# Patient Record
Sex: Male | Born: 1975 | Race: White | Hispanic: No | Marital: Married | Smoking: Current every day smoker
Health system: Southern US, Community
[De-identification: ages and names within clinical notes are randomized; demographics above are authoritative.]

## PROBLEM LIST (undated history)

## (undated) HISTORY — PX: TESTICLE SURGERY: SHX794

## (undated) HISTORY — PX: HERNIA REPAIR: SHX51

---

## 2013-12-01 ENCOUNTER — Emergency Department: Payer: Self-pay | Admitting: Emergency Medicine

## 2015-09-27 ENCOUNTER — Emergency Department (HOSPITAL_COMMUNITY)
Admission: EM | Admit: 2015-09-27 | Discharge: 2015-09-28 | Disposition: A | Discharge status: Home / Self Care | Payer: Worker's Compensation | Attending: Physician Assistant | Admitting: Physician Assistant

## 2015-09-27 ENCOUNTER — Emergency Department (HOSPITAL_COMMUNITY): Payer: Worker's Compensation

## 2015-09-27 ENCOUNTER — Encounter (HOSPITAL_COMMUNITY): Payer: Self-pay | Admitting: Emergency Medicine

## 2015-09-27 DIAGNOSIS — X58XXXA Exposure to other specified factors, initial encounter: Secondary | ICD-10-CM | POA: Diagnosis not present

## 2015-09-27 DIAGNOSIS — Y998 Other external cause status: Secondary | ICD-10-CM | POA: Insufficient documentation

## 2015-09-27 DIAGNOSIS — S0592XA Unspecified injury of left eye and orbit, initial encounter: Secondary | ICD-10-CM | POA: Diagnosis not present

## 2015-09-27 DIAGNOSIS — Y9289 Other specified places as the place of occurrence of the external cause: Secondary | ICD-10-CM | POA: Insufficient documentation

## 2015-09-27 DIAGNOSIS — Y9389 Activity, other specified: Secondary | ICD-10-CM | POA: Diagnosis not present

## 2015-09-27 DIAGNOSIS — H5789 Other specified disorders of eye and adnexa: Secondary | ICD-10-CM

## 2015-09-27 MED ORDER — TETRACAINE HCL 0.5 % OP SOLN
2.0000 [drp] | Freq: Once | OPHTHALMIC | Status: AC
  Administered 2015-09-27: 2 [drp] via OPHTHALMIC
  Filled 2015-09-27: qty 2

## 2015-09-27 MED ORDER — FLUORESCEIN SODIUM 1 MG OP STRP
1.0000 | ORAL_STRIP | Freq: Once | OPHTHALMIC | Status: AC
  Administered 2015-09-27: 1 via OPHTHALMIC
  Filled 2015-09-27: qty 1

## 2015-09-27 NOTE — ED Provider Notes (Signed)
CSN: [id number]     Arrival date & time 09/27/15  2101 History  By signing my name below, I, Lyndel SafeKaitlyn Shelton, attest that this documentation has been prepared under the direction and in the presence of [employer] Tiara Maultsby, PA-C. Electronically Signed: Lyndel SafeKaitlyn Shelton, ED Scribe. 09/27/2015. 9:31 PM.   Chief Complaint  Patient presents with  . Eye Pain   The history is provided by the patient. No language interpreter was used.   HPI Comments: Jovita KussmaulMatthew Hollings is a 40 y.o. male who presents to the Emergency Department complaining of sudden onset, constant, severe burning pain to left eye s/p injury that occurred 1 hour ago, pain completely resolved and patient was walking from the waiting room into the emergency department. Pt reports he was grinding the edges of a metal sheet when a piece of metal flew behind his safety goggles into his left eye. He reports he flushed his left eye with water for 15 minutes after the incident without relief of burning and irritation to eye. Pt mentions a prior similar episode when he needed rust ring removed Last Tdap 2015.   History reviewed. No pertinent past medical history. History reviewed. No pertinent past surgical history. No family history on file. Social History  Substance Use Topics  . Smoking status: Never Smoker   . Smokeless tobacco: None  . Alcohol Use: No    Review of Systems A complete 10 system review of systems was obtained and is otherwise negative except at noted in the HPI and PMH.  Allergies  Review of patient's allergies indicates no known allergies.  Home Medications   Prior to Admission medications   Not on File   BP 128/91 mmHg  Pulse 71  Temp(Src) 98 F (36.7 C) (Oral)  Resp 18  Ht 5\' 6"  (1.676 m)  Wt 165 lb 8 oz (75.07 kg)  BMI 26.73 kg/m2  SpO2 97% Physical Exam  Constitutional: He is oriented to person, place, and time. He appears well-developed and well-nourished. No distress.  HENT:  Head: Normocephalic.  Lids  and lashes normal.  Pupils equal round and reactive to light, no conjunctival injection,  extraocular movement is intact.   No abnormal uptake on fluorescein stain, slit-lamp with normal anterior chamber exam.  Eyes: Conjunctivae and EOM are normal.  Neck: Normal range of motion. Neck supple.  Cardiovascular: Normal rate.   Pulmonary/Chest: Effort normal. No stridor. No respiratory distress.  Musculoskeletal: Normal range of motion.  Neurological: He is alert and oriented to person, place, and time. Coordination normal.  Skin: Skin is warm.  Psychiatric: He has a normal mood and affect. His behavior is normal.  Nursing note and vitals reviewed.   ED Course  Procedures  DIAGNOSTIC STUDIES: Oxygen Saturation is 97% on RA, normal by my interpretation.    COORDINATION OF CARE: 9:29 PM Discussed treatment plan which includes to perform eye exam and order CT of orbits with pt. Pt acknowledges and agrees to plan.    Imaging Review No results found. I have personally reviewed and evaluated these images results as part of my medical decision-making.   MDM   Final diagnoses:  Irritation of left eye    Filed Vitals:   09/27/15 2106 09/28/15 0047  BP: 128/91 122/86  Pulse: 71 68  Temp: 98 F (36.7 C)   TempSrc: Oral   Resp: 18 17  Height: 5\' 6"  (1.676 m)   Weight: 75.07 kg   SpO2: 97% 98%    Medications  fluorescein ophthalmic strip 1  strip (1 strip Left Eye Given 09/27/15 2150)  tetracaine (PONTOCAINE) 0.5 % ophthalmic solution 2 drop (2 drops Right Eye Given 09/27/15 2150)    Harold Moncus is 40 y.o. male presenting with foreign body sensation to left eye, visual acuity normal. Slit lamp and fluorescein stain negative, CT orbits with no radiopaque foreign body. Patient advised to follow closely with ophthalmology. Extensive discussion of return precautions.  Evaluation does not show pathology that would require ongoing emergent intervention or inpatient treatment. Pt is  hemodynamically stable and mentating appropriately. Discussed findings and plan with patient/guardian, who agrees with care plan. All questions answered. Return precautions discussed and outpatient follow up given.   I personally performed the services described in this documentation, which was scribed in my presence. The recorded information has been reviewed and is accurate.     Wynetta Emery, PA-C 09/28/15 0053  Courteney Randall An, MD 10/02/15 [id number]

## 2015-09-27 NOTE — ED Notes (Signed)
Pt was grinding metal and feels like something went behind his safety goggles. Pt states he flushed his eyes out with water for about 10 minutes. denies any blurry vision just burning and irration to left eye.

## 2015-09-28 NOTE — ED Notes (Signed)
Pt stable, ambulatory, states understanding of discharge instructions 

## 2015-09-28 NOTE — Discharge Instructions (Signed)
Do not hesitate to return to the emergency room for any new, worsening or concerning symptoms. ° °Please obtain primary care using resource guide below. Let them know that you were seen in the emergency room and that they will need to obtain records for further outpatient management. ° ° ° °Emergency Department Resource Guide °1) Find a Doctor and Pay Out of Pocket °Although you won't have to find out who is covered by your insurance plan, it is a good idea to ask around and get recommendations. You will then need to call the office and see if the doctor you have chosen will accept you as a new patient and what types of options they offer for patients who are self-pay. Some doctors offer discounts or will set up payment plans for their patients who do not have insurance, but you will need to ask so you aren't surprised when you get to your appointment. ° °2) Contact Your Local Health Department °Not all health departments have doctors that can see patients for sick visits, but many do, so it is worth a call to see if yours does. If you don't know where your local health department is, you can check in your phone book. The CDC also has a tool to help you locate your state's health department, and many state websites also have listings of all of their local health departments. ° °3) Find a Walk-in Clinic °If your illness is not likely to be very severe or complicated, you may want to try a walk in clinic. These are popping up all over the country in pharmacies, drugstores, and shopping centers. They're usually staffed by nurse practitioners or physician assistants that have been trained to treat common illnesses and complaints. They're usually fairly quick and inexpensive. However, if you have serious medical issues or chronic medical problems, these are probably not your best option. ° °No Primary Care Doctor: °- Call [facility] at  832-8000 - they can help you locate a primary care doctor that  accepts your  insurance, provides certain services, etc. °- Physician Referral Service- 1-800-533-3463 ° °Chronic Pain Problems: °Organization         Address  Phone   Notes  °[facility]  (336) 297-2271 Patients need to be referred by their primary care doctor.  ° °Medication Assistance: °Organization         Address  Phone   Notes  °[place] [facility] [address] °[place], [place] [address] (336) 641-8030 --Must be a resident of [place] °-- Must have NO insurance coverage whatsoever (no Medicaid/ Medicare, etc.) °-- The pt. MUST have a primary care doctor that directs their care regularly and follows them in the community °  °[facility]  (866) 331-1348   °[employer]  (888) 892-1162   ° °Agencies that provide inexpensive medical care: °Organization         Address  Phone   Notes  °[facility]  (336) 832-8035   °[facility]    (336) 832-7272   °[facility] [address] °[place], [place] [address] (336) 832-4777   °[facility] [address], °[place] (336) 271-4999   °[facility]    (336) 373-0678   °[facility]    (336) 272-1050   °[facility] ° [address], [place] Phone:  (336) 832-4444, Fax:  (336) 832-4440 Hours of Operation:  9 am -   6 pm, M-F.  Also accepts Medicaid/Medicare and self-pay.  °[facility] ° [address], [place] Phone: (336) 832-3150, Fax: (336) [phone]. Hours of Operation:  8:30 am - 5:30 pm, M-F.  Also accepts Medicaid and self-pay.  °[facility] [place] [address], [place] Phone: (336) 878-6027   °[facility] [address], [place], [place] (336)723-1848, Ext. 123 Mondays & Thursdays: 7-9 AM.  First 15 patients are seen on a first come, first serve basis. °  ° °Medicaid-accepting [place] Providers: ° °Organization          Address  Phone   Notes  °[facility] [address], [place] (336) 641-2100 Also accepts self-pay patients.  °[facility] [address], [place] ° (336) 856-9996   °[facility] [address], [place] (336) 288-8857   °[facility] [address] [place] [address], [place] (336) 299-7000   °Veita Bland [address], [place]  ° (336) 373-1557 Only accepts [place] Access Medicaid patients after they have their name applied to their card.  ° °Self-Pay (no insurance) in [place]: ° °Organization         Address  Phone   Notes  °Sickle Cell Patients, [facility] [address], [place] (336) 832-1970   °[facility] [address], [place] (336) 832-4400   °[facility] ° [address], [place] (336) 992-4800   °[facility]/Dr. Osei-Bonsu ° [address] [place] [address], [place] or [address], [place] (336) 841-8500 Phone number for both [place] and [place] locations is the same.  °[facility] [address], [place] (336) 299-0000   °[facility] [address] [place] [address], [place] or [address] (336) 852-7530 °(336) 878-2260   °[facility] [address], [place] (336) 350-1642, phone; (336) 294-5005, fax Sees patients 1st and 3rd Saturday of every month.  Must not qualify for public or private insurance (i.e. Medicaid, Medicare, [place] Health Choice, Veterans' Benefits) • Household income should be no more than 200% of the poverty level •The clinic cannot treat you if you are pregnant or think you are pregnant • Sexually transmitted diseases are not treated at the clinic.  ° ° °Dental Care: °Organization         Address  Phone  Notes  °[facility] [address],   (336) 641-6152 Accepts children up to age 21 who are enrolled in Medicaid or [place] Health Choice; pregnant women with a Medicaid card; and children who have applied for Medicaid or [place] Health Choice, but were declined, whose parents can pay a reduced fee at time of service.  °[facility] [place]  [address], [place] (336) 641-7733 Accepts children up to age 21 who are enrolled in Medicaid or  Health Choice; pregnant women with a Medicaid card; and children who have applied for Medicaid or  Health Choice, but were declined, whose parents can pay a reduced fee at time of service.  °[facility] ° [address],  (336) 641-4533 Patients are seen by appointment only. Walk-ins are not accepted. [facility] will see patients 18 years of age and   older. °Monday - Tuesday (8am-5pm) °Most Wednesdays (8:30-5pm) °$30 per visit, cash only  °[facility] ° [address], [place] (336) 641-4533 Patients are seen by appointment only. Walk-ins are not accepted. [facility] will see patients 18 years of age and older. °One Wednesday Evening (Monthly: Volunteer Based).  $30 per visit, cash only  °[facility]  (919) 537-3737 for adults; Children under age 4, call Graduate Pediatric Dentistry at (919) [phone]. Children aged 4-14, please call (919) [phone] to request a pediatric application. ° Dental services are provided in all areas of dental care including fillings, crowns and bridges, complete and partial dentures, implants, gum treatment, root canals, and extractions. Preventive care is also provided. Treatment is provided to both adults and children. °Patients are selected via a lottery and there is often a waiting list. °  °[facility] [address], °[place] ° (336) 763-8833 [email or web address] °  °[facility] [address], [place], [place]  (336)723-1848, Ext. 123 Second and Fourth Thursday of each month, opens at 6:30 AM; Clinic ends at 9 AM.  Patients are seen on a first-come first-served basis, and a limited number are seen during each clinic.  ° °[facility] ° [address], [place], [place] (336) 723-7904   Eligibility Requirements °You must have lived in [place], [place], or [place] counties for at least the last three months. °  You cannot be eligible for state or federal sponsored healthcare insurance, including Veterans Administration, Medicaid, or Medicare. °  You generally cannot be eligible for healthcare insurance through your employer.  °  How to apply: °Eligibility screenings are held every Tuesday and Wednesday afternoon from 1:00 pm until 4:00 pm. You do not need an appointment for the interview!  °[facility] [address], [place], [place] 336-631-2330   °[facility]  336-342-8273   °[facility]  336-703-3100   °[facility]  336-570-6415   ° °Behavioral Health Resources in the Community: °Intensive Outpatient Programs °Organization         Address  Phone  Notes  °[facility] [address], [place], [place] 336-878-6098   °[facility] [address], [place], [place] 336-832-9800   °[facility] [address], [place], [place] ° 336-882-2125   °[facility] [address],  °[place], [place] 1-800-853-5163 or 336-641-4981   °Substance Abuse Resources °Organization         Address  Phone  Notes  °[facility]  336-882-2125   °[facility]  336-784-9470   °[facility]  336-285-9073   °[facility]  336-845-3988   °Residential & Outpatient Substance Abuse Program  1-800-659-3381   °Psychological Services °Organization         Address  Phone  Notes  °[facility]  336- 832-9600   °[facility]  336- 378-7881    °[facility] [address], [place] 1-800-853-5163 or 336-641-4981   ° °Mobile Crisis Teams °Organization         Address  Phone  Notes  °[facility], Mobile Crisis Care Unit  1-877-626-1772   °Assertive °[facility] ° [address] [place],  336-834-9664   °Sharon DeEsch [address] °[place]  336-554-5454   ° °Self-Help/Support Groups °Organization         Address    Phone             Notes  °[facility] - variety of support groups  336- 373-1402 Call for more information  °Narcotics Anonymous (NA), [facility] [address], °[place]  2 meetings at this location  ° °Residential Treatment Programs °Organization         Address  Phone  Notes  °[facility] [address],    °[place]  1-866-801-8205   °[facility] ° [address], [place], [place] 704-293-8524   °[facility] [address], [place] 336-845-3988 Admissions: 8am-3pm M-F  °[facility] [address]    °[place], [place] 336-841-1104   °[facility] [address], [place], [place] 336-379-7146   °[facility] [address]  °[place], [place] 336-285-9073   °[facility] - Intensive Outpatient [address], [place], [place] 336-852-3033   °[facility] [address]  °[place], [place] 1-877-615-2722 or 336-784-9470   °[facility] [address] [place], [place] 336-227-7417 Accepts Medicaid  °[facility] [address]  °[place] 1-800-659-3381 Substance Abuse/Addiction Treatment  ° °[place] Behavioral Health Resources °Organization         Address  Phone  Notes  °[facility]  (888) 581-9988   °Julie Brannon, PhD [address] [place], [place]   (336) 349-5553 or (336) 951-0000   °[facility]   [address] °[place], [place] (336) 349-4454   °[facility] [address], [place], [place] (336) 342-8316 Insurance/Medicaid/sponsorship through [facility]  °[facility] [address]                                    [place], [place] (336) 342-8316 Therapy/tele-psych/case  °[facility] [address]  ° [place], [place] (336) 349-2233    °Dr. Arfeen  (336) 349-4544   °[facility] [place]  [employer] [place] [facility] 1) [address], [place] °2) [address], [place] °3)  [address], [place] (336) 349-3220 °(336) 342-7768 ° °(336) 342-8140   °[place] [facility] (336) 342-1394 or (336) 342-3537 (After Hours)    ° ° ° °

## 2015-11-19 ENCOUNTER — Emergency Department
Admission: EM | Admit: 2015-11-19 | Discharge: 2015-11-19 | Disposition: A | Discharge status: Home / Self Care | Payer: BC Managed Care – PPO | Attending: Emergency Medicine | Admitting: Emergency Medicine

## 2015-11-19 ENCOUNTER — Encounter: Payer: Self-pay | Admitting: *Deleted

## 2015-11-19 ENCOUNTER — Other Ambulatory Visit: Payer: Self-pay

## 2015-11-19 DIAGNOSIS — H81399 Other peripheral vertigo, unspecified ear: Secondary | ICD-10-CM | POA: Diagnosis not present

## 2015-11-19 DIAGNOSIS — R200 Anesthesia of skin: Secondary | ICD-10-CM | POA: Diagnosis present

## 2015-11-19 DIAGNOSIS — H6691 Otitis media, unspecified, right ear: Secondary | ICD-10-CM | POA: Insufficient documentation

## 2015-11-19 DIAGNOSIS — R42 Dizziness and giddiness: Secondary | ICD-10-CM

## 2015-11-19 LAB — BASIC METABOLIC PANEL
ANION GAP: 5 (ref 5–15)
BUN: 9 mg/dL (ref 6–20)
CALCIUM: 9.1 mg/dL (ref 8.9–10.3)
CO2: 29 mmol/L (ref 22–32)
Chloride: 105 mmol/L (ref 101–111)
Creatinine, Ser: 0.88 mg/dL (ref 0.61–1.24)
GFR calc Af Amer: >60 mL/min (ref >60)
GLUCOSE: 111 mg/dL — AB (ref 65–99)
Potassium: 4.2 mmol/L (ref 3.5–5.1)
SODIUM: 139 mmol/L (ref 135–145)

## 2015-11-19 LAB — CBC
HCT: 44.3 % (ref 40.0–52.0)
Hemoglobin: 15.2 g/dL (ref 13.0–18.0)
MCH: 31.5 pg (ref 26.0–34.0)
MCHC: 34.3 g/dL (ref 32.0–36.0)
MCV: 91.9 fL (ref 80.0–100.0)
Platelets: 200 10*3/uL (ref 150–440)
RBC: 4.82 MIL/uL (ref 4.40–5.90)
RDW: 12.9 % (ref 11.5–14.5)
WBC: 5.2 10*3/uL (ref 3.8–10.6)

## 2015-11-19 MED ORDER — MECLIZINE HCL 12.5 MG PO TABS: 12.5000 mg | ORAL_TABLET | Freq: Three times a day (TID) | ORAL | Status: DC | PRN

## 2015-11-19 MED ORDER — AMOXICILLIN 500 MG PO CAPS: 500.0000 mg | ORAL_CAPSULE | Freq: Three times a day (TID) | ORAL | Status: AC

## 2015-11-19 NOTE — ED Notes (Signed)
Pt arrived to triage via EMS.  A&o, skin w/d with good color. EMS reports c/o cp and left side numbness onset this am.  Neg stroke scale.  NSR.

## 2015-11-19 NOTE — Discharge Instructions (Signed)
Vertigo °Vertigo means you feel like you or your surroundings are moving when they are not. Vertigo can be dangerous if it occurs when you are at work, driving, or performing difficult activities.  °CAUSES  °Vertigo occurs when there is a conflict of signals sent to your brain from the visual and sensory systems in your body. There are many different causes of vertigo, including: °· Infections, especially in the inner ear. °· A bad reaction to a drug or misuse of alcohol and medicines. °· Withdrawal from drugs or alcohol. °· Rapidly changing positions, such as lying down or rolling over in bed. °· A migraine headache. °· Decreased blood flow to the brain. °· Increased pressure in the brain from a head injury, infection, tumor, or bleeding. °SYMPTOMS  °You may feel as though the world is spinning around or you are falling to the ground. Because your balance is upset, vertigo can cause nausea and vomiting. You may have involuntary eye movements (nystagmus). °DIAGNOSIS  °Vertigo is usually diagnosed by physical exam. If the cause of your vertigo is unknown, your caregiver may perform imaging tests, such as an MRI scan (magnetic resonance imaging). °TREATMENT  °Most cases of vertigo resolve on their own, without treatment. Depending on the cause, your caregiver may prescribe certain medicines. If your vertigo is related to body position issues, your caregiver may recommend movements or procedures to correct the problem. In rare cases, if your vertigo is caused by certain inner ear problems, you may need surgery. °HOME CARE INSTRUCTIONS  °· Follow your caregiver's instructions. °· Avoid driving. °· Avoid operating heavy machinery. °· Avoid performing any tasks that would be dangerous to you or others during a vertigo episode. °· Tell your caregiver if you notice that certain medicines seem to be causing your vertigo. Some of the medicines used to treat vertigo episodes can actually make them worse in some people. °SEEK  IMMEDIATE MEDICAL CARE IF:  °· Your medicines do not relieve your vertigo or are making it worse. °· You develop problems with talking, walking, weakness, or using your arms, hands, or legs. °· You develop severe headaches. °· Your nausea or vomiting continues or gets worse. °· You develop visual changes. °· A family member notices behavioral changes. °· Your condition gets worse. °MAKE SURE YOU: °· Understand these instructions. °· Will watch your condition. °· Will get help right away if you are not doing well or get worse. °  °This information is not intended to replace advice given to you by your health care provider. Make sure you discuss any questions you have with your health care provider. °  °Document Released: 06/11/2005 Document Revised: 11/24/2011 Document Reviewed: 12/25/2014 °Elsevier Interactive Patient Education ©2016 [employer] ° ° °Please return immediately if condition worsens. Please contact her primary physician or the physician you were given for referral. If you have any specialist physicians involved in her treatment and plan please also contact them. Thank you for using [facility] emergency [facility]. ° °

## 2015-11-19 NOTE — ED Provider Notes (Signed)
Time Seen: Approximately 1520  I have reviewed the triage notes  Chief Complaint: Numbness   History of Present Illness: Timothy KussmaulMatthew Oneal is a 40 y.o. male who describes symptoms of dizziness. On further questioning to this historian patient states that his dizziness seems to be swimmy headed is a "" just got off the boat "". He denies any feelings of lightheadedness to this historian. He states that he had some numbness in his left arm with some tingling but otherwise denies any focal weakness in either upper or lower extremities. Patient states he has had some ear pain and points primarily to the right ear. Patient states he has noticed that his blood pressures been a little elevated today. Otherwise he has no history of hypertension. He denies any chest pain, shortness of breath, persistent vomiting, diarrhea or any other concerns.   History reviewed. No pertinent past medical history.  There are no active problems to display for this patient.   History reviewed. No pertinent past surgical history.  History reviewed. No pertinent past surgical history.  Current Outpatient Rx  Name  Route  Sig  Dispense  Refill  . amoxicillin (AMOXIL) 500 MG capsule   Oral   Take 1 capsule (500 mg total) by mouth 3 (three) times daily.   21 capsule   0   . ibuprofen (ADVIL,MOTRIN) 200 MG tablet   Oral   Take 400-800 mg by mouth every 6 (six) hours as needed for mild pain.         . meclizine (ANTIVERT) 12.5 MG tablet   Oral   Take 1 tablet (12.5 mg total) by mouth 3 (three) times daily as needed for dizziness or nausea.   30 tablet   1     Allergies:  Review of patient's allergies indicates no known allergies.  Family History: History reviewed. No pertinent family history.  Social History: Social History  Substance Use Topics  . Smoking status: Never Smoker   . Smokeless tobacco: None  . Alcohol Use: No     Review of Systems:   10 point review of systems was performed and  was otherwise negative:  Constitutional: No fever Eyes: No visual disturbances ENT: No sore throat, right ear pain described above Cardiac: No chest pain Respiratory: No shortness of breath, wheezing, or stridor Abdomen: No abdominal pain, no vomiting, No diarrhea Endocrine: No weight loss, No night sweats Extremities: No peripheral edema, cyanosis Skin: No rashes, easy bruising Neurologic: No focal weakness, trouble with speech or swollowing Urologic: No dysuria, Hematuria, or urinary frequency   Physical Exam:  ED Triage Vitals  Enc Vitals Group     BP 11/19/15 1454 145/86 mmHg     Pulse Rate 11/19/15 1454 73     Resp 11/19/15 1454 18     Temp 11/19/15 1454 98.1 F (36.7 C)     Temp Source 11/19/15 1454 Oral     SpO2 11/19/15 1454 100 %     Weight 11/19/15 1454 160 lb (72.576 kg)     Height 11/19/15 1454 5\' 3"  (1.6 m)     Head Cir --      Peak Flow --      Pain Score --      Pain Loc --      Pain Edu? --      Excl. in GC? --     General: Awake , Alert , and Oriented times 3; GCS 15 Head: Normal cephalic , atraumatic Eyes: Pupils equal , round,  reactive to light Nose/Throat: No nasal drainage, patent upper airway without erythema or exudate.  Right tympanic membrane shows mild erythema with obscuring of the landmarks. Left TM appears to have some fluid behind the left tympanic membrane but otherwise no significant findings Neck: Supple, Full range of motion, No anterior adenopathy or palpable thyroid masses Lungs: Clear to ascultation without wheezes , rhonchi, or rales Heart: Regular rate, regular rhythm without murmurs , gallops , or rubs Abdomen: Soft, non tender without rebound, guarding , or rigidity; bowel sounds positive and symmetric in all 4 quadrants. No organomegaly .        Extremities: 2 plus symmetric pulses. No edema, clubbing or cyanosis Neurologic: normal ambulation, Motor symmetric without deficits, sensory intact mild exhausted nystagmus Skin: warm,  dry, no rashes   Labs:   All laboratory work was reviewed including any pertinent negatives or positives listed below:  Labs Reviewed  BASIC METABOLIC PANEL - Abnormal; Notable for the following:    Glucose, Bld 111 (*)    All other components within normal limits  CBC   reviewed the patient's laboratory work shows no significant findings  EKG: ED ECG REPORT I, Jennye Moccasin, the attending physician, personally viewed and interpreted this ECG.  Date: 11/19/2015 EKG Time: 1500 Rate: 72 Rhythm: normal sinus rhythm QRS Axis: normal Intervals: normal ST/T Wave abnormalities: normal Conduction Disturbances: none Narrative Interpretation: unremarkable Normal      ED Course: * Patient's stay here was uneventful and I felt given his current clinical presentation is most likely was peripheral vertigo. I felt was unlikely to be central given the patient's age and lack of focal neurologic findings. The patient is blood pressure slightly elevated at cautioned him to follow up with a primary physician I didn't feel he needed treatment acutely here in emergency department. Patient had explanation of vertigo and its symptoms and I prescribed him amoxicillin for his otitis media.   Assessment:  Mild right otitis media Peripheral vertigo   Final Clinical Impression:   Final diagnoses:  Vertigo     Plan:  Outpatient management Patient was advised to return immediately if condition worsens. Patient was advised to follow up with their primary care physician or other specialized physicians involved in their outpatient care             Jennye Moccasin, MD 11/19/15 2222

## 2015-11-19 NOTE — ED Notes (Signed)
States he was driving yesterday and felt numbness in his left arm, light headedness, states his eyes would not stop watering, states at present he feels "swimmy headed" and some residual numbness in his left arm, pt awake and alert, face symmetrical, grips even

## 2015-12-26 ENCOUNTER — Emergency Department (HOSPITAL_COMMUNITY)
Admission: EM | Admit: 2015-12-26 | Discharge: 2015-12-26 | Disposition: A | Discharge status: Home / Self Care | Payer: Worker's Compensation | Attending: Emergency Medicine | Admitting: Emergency Medicine

## 2015-12-26 ENCOUNTER — Encounter (HOSPITAL_COMMUNITY): Payer: Self-pay | Admitting: *Deleted

## 2015-12-26 ENCOUNTER — Emergency Department (HOSPITAL_COMMUNITY): Payer: Worker's Compensation

## 2015-12-26 DIAGNOSIS — R109 Unspecified abdominal pain: Secondary | ICD-10-CM

## 2015-12-26 DIAGNOSIS — Y9289 Other specified places as the place of occurrence of the external cause: Secondary | ICD-10-CM | POA: Insufficient documentation

## 2015-12-26 DIAGNOSIS — S4992XA Unspecified injury of left shoulder and upper arm, initial encounter: Secondary | ICD-10-CM | POA: Diagnosis not present

## 2015-12-26 DIAGNOSIS — W172XXA Fall into hole, initial encounter: Secondary | ICD-10-CM | POA: Diagnosis not present

## 2015-12-26 DIAGNOSIS — Y998 Other external cause status: Secondary | ICD-10-CM | POA: Diagnosis not present

## 2015-12-26 DIAGNOSIS — S30811A Abrasion of abdominal wall, initial encounter: Secondary | ICD-10-CM | POA: Insufficient documentation

## 2015-12-26 DIAGNOSIS — Y9389 Activity, other specified: Secondary | ICD-10-CM | POA: Insufficient documentation

## 2015-12-26 DIAGNOSIS — T148XXA Other injury of unspecified body region, initial encounter: Secondary | ICD-10-CM

## 2015-12-26 DIAGNOSIS — W19XXXA Unspecified fall, initial encounter: Secondary | ICD-10-CM

## 2015-12-26 DIAGNOSIS — S301XXA Contusion of abdominal wall, initial encounter: Secondary | ICD-10-CM | POA: Diagnosis not present

## 2015-12-26 DIAGNOSIS — S20312A Abrasion of left front wall of thorax, initial encounter: Secondary | ICD-10-CM | POA: Insufficient documentation

## 2015-12-26 DIAGNOSIS — S3991XA Unspecified injury of abdomen, initial encounter: Secondary | ICD-10-CM | POA: Diagnosis present

## 2015-12-26 LAB — CBC WITH DIFFERENTIAL/PLATELET
Basophils Absolute: 0 K/uL (ref 0.0–0.1)
Basophils Relative: 0 %
Eosinophils Absolute: 0.1 K/uL (ref 0.0–0.7)
Eosinophils Relative: 1 %
HCT: 43.9 % (ref 39.0–52.0)
Hemoglobin: 14.6 g/dL (ref 13.0–17.0)
Lymphocytes Relative: 11 %
Lymphs Abs: 1.3 K/uL (ref 0.7–4.0)
MCH: 30.2 pg (ref 26.0–34.0)
MCHC: 33.3 g/dL (ref 30.0–36.0)
MCV: 90.9 fL (ref 78.0–100.0)
Monocytes Absolute: 0.7 K/uL (ref 0.1–1.0)
Monocytes Relative: 6 %
Neutro Abs: 9.2 K/uL — ABNORMAL HIGH (ref 1.7–7.7)
Neutrophils Relative %: 82 %
Platelets: 184 K/uL (ref 150–400)
RBC: 4.83 MIL/uL (ref 4.22–5.81)
RDW: 12.4 % (ref 11.5–15.5)
WBC: 11.2 K/uL — ABNORMAL HIGH (ref 4.0–10.5)

## 2015-12-26 LAB — I-STAT CHEM 8, ED
BUN: 11 mg/dL (ref 6–20)
Calcium, Ion: 1.19 mmol/L (ref 1.12–1.23)
Chloride: 99 mmol/L — ABNORMAL LOW (ref 101–111)
Creatinine, Ser: 0.8 mg/dL (ref 0.61–1.24)
Glucose, Bld: 99 mg/dL (ref 65–99)
HCT: 48 % (ref 39.0–52.0)
Hemoglobin: 16.3 g/dL (ref 13.0–17.0)
Potassium: 4.1 mmol/L (ref 3.5–5.1)
Sodium: 140 mmol/L (ref 135–145)
TCO2: 26 mmol/L (ref 0–100)

## 2015-12-26 MED ORDER — BACITRACIN ZINC 500 UNIT/GM EX OINT
TOPICAL_OINTMENT | Freq: Two times a day (BID) | CUTANEOUS | Status: DC
Start: 2015-12-26 — End: 2015-12-26
  Administered 2015-12-26: 1 via TOPICAL
  Filled 2015-12-26: qty 0.9

## 2015-12-26 MED ORDER — IBUPROFEN 800 MG PO TABS: 800.0000 mg | ORAL_TABLET | Freq: Three times a day (TID) | ORAL | Status: DC

## 2015-12-26 MED ORDER — OXYCODONE-ACETAMINOPHEN 5-325 MG PO TABS
1.0000 | ORAL_TABLET | ORAL | Status: DC | PRN
  Administered 2015-12-26: 1 via ORAL

## 2015-12-26 MED ORDER — HYDROCODONE-ACETAMINOPHEN 5-325 MG PO TABS: 1.0000 | ORAL_TABLET | ORAL | Status: DC | PRN

## 2015-12-26 MED ORDER — IOPAMIDOL (ISOVUE-300) INJECTION 61%
INTRAVENOUS | Status: AC
  Administered 2015-12-26: 100 mL
  Filled 2015-12-26: qty 100

## 2015-12-26 MED ORDER — OXYCODONE-ACETAMINOPHEN 5-325 MG PO TABS
ORAL_TABLET | ORAL | Status: AC
  Filled 2015-12-26: qty 1

## 2015-12-26 MED ORDER — HYDROMORPHONE HCL 1 MG/ML IJ SOLN
1.0000 mg | Freq: Once | INTRAMUSCULAR | Status: AC
  Administered 2015-12-26: 1 mg via INTRAVENOUS
  Filled 2015-12-26: qty 1

## 2015-12-26 NOTE — ED Notes (Signed)
Declined W/C at D/C and was escorted to lobby by RN. 

## 2015-12-26 NOTE — ED Provider Notes (Signed)
4: 15 PM: [facility] at shift change.  Patient sustained a fall, now complaining of a large abrasion and swelling to his left torso. TTP along left flank with swelling and large abrasion. Tdap up to date. CT chest, abdomen/pelvis pending. Anticipate discharge home with Norco and ibuprofen.   CT chest, abdomen/pelvis negative for acute abnormalities.  Bacitracin and dressing applied to abrasion.  Evaluation does not show pathology requiring ongoing emergent intervention or admission. Pt is hemodynamically stable and mentating appropriately. Discussed findings/results and plan with patient/guardian, who agrees with plan. All questions answered. Return precautions discussed and outpatient follow up given.      Cheri FowlerKayla Jerid Catherman, PA-C 12/26/15 1657  Marily MemosJason Mesner, MD 12/29/15 [phone]

## 2015-12-26 NOTE — ED Notes (Signed)
Pt reports falling into a pit at work. Pt was able to catch himself before falling deeper into the pit. Has large abrasion, swelling, bruising to left side.

## 2015-12-26 NOTE — Discharge Instructions (Signed)
CT scans from today did not show any acute abnormalities sustained from your fall. Please take Norco every 4 hours as needed for pain. He may also take 800 mg ibuprofen 3 times a day as needed for pain. Please follow-up with your primary care doctor in 1 week for reevaluation.  Blunt Abdominal Trauma  Blunt abdominal trauma is a type of injury that involves damage to the abdominal wall or to abdominal organs, such as the liver or spleen. The damage can involve bruising, tearing, or a rupture. This type of injury does not involve a puncture of the skin.  Blunt abdominal trauma can range from mild to severe. In some cases it can lead to a severe abdominal inflammation (peritonitis), severe bleeding, and a dangerous drop in blood pressure.  CAUSES  This injury is caused by a hard, direct hit to the abdomen. It can happen after:  A motor vehicle accident.  Being kicked or punched in the abdomen.  Falling from a significant height. RISK FACTORS  This injury is more likely to happen in people who:  Play contact sports.  Work in a job in which falls or injuries are more likely, such as in Holiday representative. SYMPTOMS  The main symptom of this condition is pain in the abdomen. Other symptoms depend on the type and location of the injury. They can include:  Abdominal pain that spreads to the the back or shoulder.  Bruising.  Swelling.  Pain when pressing on the abdomen.  Blood in the urine.  Weakness.  Confusion.  Loss of consciousness.  Pale, dusky, cool, or sweaty skin.  Vomiting blood.  Bloody stool or bleeding from the rectum.  Trouble breathing. Symptoms of this injury can develop suddenly or slowly.  DIAGNOSIS  This injury is diagnosed based on your symptoms and a physical exam. You may also have tests, including:  Blood tests.  Urine tests.  Imaging tests, such as:  A CT scan and ultrasound of your abdomen.  X-rays of your chest and abdomen. A test in which a tube is used to flush your  abdomen with fluid and check for blood (diagnostic peritoneal lavage). TREATMENT  Treatment for this injury depends on its type and severity. Treatment options include:  Observation. If the injury is mild, this may be the only treatment needed.  Support of your blood pressure and breathing.  Getting blood, fluids, or medicine through an IV tube.  Antibiotic medicine.  Insertion of tubes into the stomach or bladder.  A blood transfusion.  A procedure to stop bleeding. This involves putting a long, thin tube (catheter) into one of your blood vessels (angiographic embolization).  Surgery to open up your abdomen and control bleeding or repair damage (laparotomy). This may be done if tests suggest that you have peritonitis or bleeding that cannot be controlled with angiographic embolization. HOME CARE INSTRUCTIONS  Take medicines only as directed by your health care provider.  If you were prescribed an antibiotic medicine, finish all of it even if you start to feel better.  Follow your health care provider's instructions about diet and activity restrictions.  Keep all follow-up visits as directed by your health care provider. This is important. SEEK MEDICAL CARE IF:  You continue to have abdominal pain.  Your symptoms return.  You develop new symptoms.  You have blood in your urine or your bowel movements. SEEK IMMEDIATE MEDICAL CARE IF:  You vomit blood.  You have heavy bleeding from your rectum.  You have very bad abdominal  pain.  You have trouble breathing.  You have chest pain.  You have a fever.  You have dizziness.  You pass out. This information is not intended to replace advice given to you by your health care provider. Make sure you discuss any questions you have with your health care provider.  Document Released: 10/09/2004 Document Revised: 01/16/2015 Document Reviewed: 08/23/2014  Elsevier Interactive Patient Education [employer]2016 [employer]vier Inc[employer]

## 2015-12-26 NOTE — ED Provider Notes (Signed)
CSN: [id number]     Arrival date & time 12/26/15  1218 History  By signing my name below, I, Timothy Oneal, attest that this documentation has been prepared under the direction and in the presence of AvayaSamantha Dowless, PA-C. Electronically Signed: Angelene GiovanniEmmanuella Oneal, ED Scribe. 12/26/2015. 3:17 PM.    Chief Complaint  Patient presents with  . Fall   The history is provided by the patient. No language interpreter was used.   HPI Comments: Timothy Oneal is a 40 y.o. male who presents to the Emergency Department complaining of a large abrasion and swelling to his left torso and left shoulder pain s/p fall that occurred at approx. 10:45 am today. He reports associated mild pain with deep breathing. Pt explains that he fell into a pit that is filled with fluid as he scrapped his left torso while at work. No LOC. Pt states that he received a Percocet in the waiting room that provided temporary relief but no alleviating factors noted PTA. He reports NKDA. He denies any n/v, abdominal pain, or numbness.                          History reviewed. No pertinent past medical history. History reviewed. No pertinent past surgical history. History reviewed. No pertinent family history. Social History  Substance Use Topics  . Smoking status: Never Smoker   . Smokeless tobacco: None  . Alcohol Use: No    Review of Systems  Constitutional: Negative for fever and chills.  Gastrointestinal: Negative for nausea, vomiting and abdominal pain.  Musculoskeletal: Positive for joint swelling and arthralgias.  Skin: Positive for wound (abrasion).  Neurological: Negative for syncope and numbness.      Allergies  Review of patient's allergies indicates no known allergies.  Home Medications   Prior to Admission medications   Medication Sig Start Date End Date Taking? Authorizing Provider  ibuprofen (ADVIL,MOTRIN) 200 MG tablet Take 400-800 mg by mouth every 6 (six) hours as needed for mild pain.     Historical Provider, MD  meclizine (ANTIVERT) 12.5 MG tablet Take 1 tablet (12.5 mg total) by mouth 3 (three) times daily as needed for dizziness or nausea. 11/19/15   Jennye MoccasinBrian S Quigley, MD   BP 135/96 mmHg  Pulse 87  Temp(Src) 98.3 F (36.8 C) (Oral)  Resp 18  Wt 165 lb 6.4 oz (75.025 kg)  SpO2 98% Physical Exam  Constitutional: He is oriented to person, place, and time. He appears well-developed and well-nourished. No distress.  HENT:  Head: Normocephalic and atraumatic.  Eyes: Conjunctivae are normal. Right eye exhibits no discharge. Left eye exhibits no discharge. No scleral icterus.  Cardiovascular: Normal rate.   Pulmonary/Chest: Effort normal.  Musculoskeletal:  Large superficial abrasion to left flank and chest wall as shown in picture. Significant flank tenderness and swelling. Mild tenderness over left AC joint, no decreased ROM, no obvious deformities.   Neurological: He is alert and oriented to person, place, and time. Coordination normal.  Skin: Skin is warm and dry. No rash noted. He is not diaphoretic. No erythema. No pallor.  Psychiatric: He has a normal mood and affect. His behavior is normal.  Nursing note and vitals reviewed.   ED Course  Procedures (including critical care time) DIAGNOSTIC STUDIES: Oxygen Saturation is 98% on RA, normal by my interpretation.    COORDINATION OF CARE: 3:06 PM- Pt advised of plan for treatment and pt agrees. Pt will receive a CT scan for further evaluation.  Pt will also receive Dilaudid IM.   Imaging Review No results found.      Gaylyn Rong, PA-C has personally reviewed and evaluated these images as part of her medical decision-making.  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 Pt appears to be in discomfort but otherwise appears well. All VSS. Concern for  intraabdominal hematoma vs splenic laceration. Will obtain CT chest/abd/pelvis to evaluate. Pain managed in ED. Pt signed out to [name] PA-C at shift change pending imaging. If negative, pt may be discharged with pain medication. If CT reveals acute abnormality, treat accordingly.     I personally performed the services described in this documentation, which was scribed in my presence. The recorded information has been reviewed and is accurate.     [name], PA-C 12/27/15 0750  Marily Memos, MD 12/29/15 [phone]

## 2017-10-28 ENCOUNTER — Encounter (HOSPITAL_BASED_OUTPATIENT_CLINIC_OR_DEPARTMENT_OTHER): Payer: Self-pay

## 2017-10-28 ENCOUNTER — Emergency Department (HOSPITAL_BASED_OUTPATIENT_CLINIC_OR_DEPARTMENT_OTHER): Payer: Worker's Compensation

## 2017-10-28 ENCOUNTER — Other Ambulatory Visit: Payer: Self-pay

## 2017-10-28 ENCOUNTER — Emergency Department (HOSPITAL_BASED_OUTPATIENT_CLINIC_OR_DEPARTMENT_OTHER)
Admission: EM | Admit: 2017-10-28 | Discharge: 2017-10-28 | Disposition: A | Discharge status: Home / Self Care | Payer: Worker's Compensation | Attending: Emergency Medicine | Admitting: Emergency Medicine

## 2017-10-28 DIAGNOSIS — W19XXXA Unspecified fall, initial encounter: Secondary | ICD-10-CM

## 2017-10-28 DIAGNOSIS — Y999 Unspecified external cause status: Secondary | ICD-10-CM | POA: Insufficient documentation

## 2017-10-28 DIAGNOSIS — F17210 Nicotine dependence, cigarettes, uncomplicated: Secondary | ICD-10-CM | POA: Diagnosis not present

## 2017-10-28 DIAGNOSIS — S59901A Unspecified injury of right elbow, initial encounter: Secondary | ICD-10-CM | POA: Diagnosis present

## 2017-10-28 DIAGNOSIS — Y939 Activity, unspecified: Secondary | ICD-10-CM | POA: Insufficient documentation

## 2017-10-28 DIAGNOSIS — Y929 Unspecified place or not applicable: Secondary | ICD-10-CM | POA: Insufficient documentation

## 2017-10-28 DIAGNOSIS — S8992XA Unspecified injury of left lower leg, initial encounter: Secondary | ICD-10-CM | POA: Diagnosis not present

## 2017-10-28 NOTE — ED Provider Notes (Signed)
[facility] EMERGENCY DEPARTMENT Provider Note  CSN: [id number] Arrival date & time: 10/28/17 1241  Chief Complaint(s) Fall  HPI Timothy KussmaulMatthew Oneal is a 42 y.o. male who presents after a mechanical fall off of the truck 6 hours prior to arrival.  States that he lost his balance and started falling backwards off of his truck.  Prior to hitting the floor he tried to turn his body causing him to land on his left knee and right elbow.  He is endorsing pain in these areas.  Pain is exacerbated with range of motion and palpation of these areas.  Alleviated by immobilization.  Able to ambulate though limited due to pain.  Denies any head trauma or other injuries.  Denies any other physical complaints.  HPI   Past Medical History History reviewed. No pertinent past medical history. There are no active problems to display for this patient.  Home Medication(s) Prior to Admission medications   Not on File                                                                                                                                    Past Surgical History Past Surgical History:  Procedure Laterality Date  . HERNIA REPAIR    . TESTICLE SURGERY     Family History No family history on file.  Social History Social History   Tobacco Use  . Smoking status: Current Every Day Smoker  . Smokeless tobacco: Never Used  Substance Use Topics  . Alcohol use: Yes    Comment: weekly  . Drug use: No   Allergies Patient has no known allergies.  Review of Systems Review of Systems All other systems are reviewed and are negative for acute change except as noted in the HPI  Physical Exam Vital Signs  I have reviewed the triage vital signs BP (!) 144/95 (BP Location: Right Wrist)   Pulse 80   Temp 98.4 F (36.9 C) (Oral)   Resp 16   Ht 5\' 6"  (1.676 m)   Wt 69.9 kg (154 lb 1.6 oz)   SpO2 100%   BMI 24.87 kg/m   Physical Exam  Constitutional: He is oriented to person, place, and  time. He appears well-developed and well-nourished. No distress.  HENT:  Head: Normocephalic.  Right Ear: External ear normal.  Left Ear: External ear normal.  Mouth/Throat: Oropharynx is clear and moist.  Eyes: Conjunctivae and EOM are normal. Pupils are equal, round, and reactive to light. Right eye exhibits no discharge. Left eye exhibits no discharge. No scleral icterus.  Neck: Normal range of motion. Neck supple.  Cardiovascular: Regular rhythm and normal heart sounds. Exam reveals no gallop and no friction rub.  No murmur heard. Pulses:      Radial pulses are 2+ on the right side, and 2+ on the left side.       Dorsalis pedis pulses are 2+ on the right side,  and 2+ on the left side.  Pulmonary/Chest: Effort normal and breath sounds normal. No stridor. No respiratory distress.  Abdominal: Soft. He exhibits no distension. There is no tenderness.  Musculoskeletal:       Right elbow: He exhibits normal range of motion, no swelling, no deformity and no laceration. Tenderness found. Radial head tenderness noted. No medial epicondyle, no lateral epicondyle and no olecranon process tenderness noted.       Left knee: He exhibits bony tenderness. He exhibits normal range of motion, no swelling, no effusion, no deformity, no erythema, normal alignment and normal patellar mobility. Tenderness found. Lateral joint line tenderness noted. No patellar tendon tenderness noted.       Cervical back: He exhibits no bony tenderness.       Thoracic back: He exhibits no bony tenderness.       Lumbar back: He exhibits no bony tenderness.       Legs: Clavicle stable. Chest stable to AP/Lat compression. Pelvis stable to Lat compression. No obvious extremity deformity. No chest or abdominal wall contusion.  Neurological: He is alert and oriented to person, place, and time. GCS eye subscore is 4. GCS verbal subscore is 5. GCS motor subscore is 6.  Moving all extremities   Skin: Skin is warm. He is not  diaphoretic.    ED Results and Treatments Labs (all labs ordered are listed, but only abnormal results are displayed) Labs Reviewed - No data to display                                                                                                                       EKG  EKG Interpretation  Date/Time:    Ventricular Rate:    PR Interval:    QRS Duration:   QT Interval:    QTC Calculation:   R Axis:     Text Interpretation:        Radiology Dg Elbow Complete Right  Result Date: 10/28/2017 CLINICAL DATA:  The patient fell off a truck landing on the left knee and right elbow. Persistent posterior elbow pain. EXAM: RIGHT ELBOW - COMPLETE 3+ VIEW COMPARISON:  None in PACs FINDINGS: The bones are subjectively adequately mineralized. There is no acute fracture or dislocation. There is no joint effusion. There is no significant degenerative change. The soft tissues are unremarkable. IMPRESSION: There is no acute or significant chronic bony abnormality of the right knee. Electronically Signed   By: David  [place] M.D.   On: 10/28/2017 13:36   Dg Knee Complete 4 Views Left  Result Date: 10/28/2017 CLINICAL DATA:  Patient fell off a truck at work landing on the left knee and right elbow. Persistent anterior knee pain with patellar region abrasions. EXAM: LEFT KNEE - COMPLETE 4+ VIEW COMPARISON:  None in PACs FINDINGS: The bones are subjectively adequately mineralized. There is no acute fracture nor dislocation. There is no joint effusion. The soft tissues are unremarkable. IMPRESSION: There is no acute or significant chronic bony abnormality  of the left knee. Electronically Signed   By: David  [place] M.D.   On: 10/28/2017 13:35   Pertinent labs & imaging results that were available during my care of the patient were reviewed by me and considered in my medical decision making (see chart for details).  Medications Ordered in ED Medications - No data to display                                                                                                                                   Procedures Procedures  (including critical care time)  Medical Decision Making / ED Course I have reviewed the nursing notes for this encounter and the patient's prior records (if available in EHR or on provided paperwork).    Mechanical fall resulting in injury to the left knee and right elbow.  No other injuries noted on exam.  Plain film without evidence of acute injuries.  Discussed possibility of occult fractures versus soft tissue contusion versus ligamentous injury.  Patient was provided with a knee sleeve.  Given strict follow-up instructions.  The patient appears reasonably screened and/or stabilized for discharge and I doubt any other medical condition or other [facility] requiring further screening, evaluation, or treatment in the ED at this time prior to discharge.  The patient is safe for discharge with strict return precautions.   Final Clinical Impression(s) / ED Diagnoses Final diagnoses:  Fall, initial encounter  Elbow injury, right, initial encounter  Injury of left knee, initial encounter    Disposition: Discharge  Condition: Good  I have discussed the results, Dx and Tx plan with the patient who expressed understanding and agree(s) with the plan. Discharge instructions discussed at great length. The patient was given strict return precautions who verbalized understanding of the instructions. No further questions at time of discharge.    ED Discharge Orders    None       Follow Up: [facility] [address] [place] [address] [phone] Go to  ask for occupational therapy to assist with worker's compensation.     This chart was dictated using voice recognition software.  Despite best efforts to proofread,  errors can occur which can change the documentation meaning.   Nira Conn, MD 10/28/17  1800

## 2017-10-28 NOTE — ED Notes (Signed)
ED Provider at bedside. 

## 2017-10-28 NOTE — ED Triage Notes (Signed)
Pt states he fell at work approx 730am-pain to LEFT knee and RIGHT elbow-NAD-steady limping gait

## 2017-11-02 ENCOUNTER — Ambulatory Visit: Payer: Self-pay

## 2017-11-02 ENCOUNTER — Other Ambulatory Visit: Payer: Self-pay | Admitting: Occupational Medicine

## 2017-11-02 DIAGNOSIS — M25511 Pain in right shoulder: Secondary | ICD-10-CM

## 2018-06-28 IMAGING — DX DG SHOULDER 2+V*R*
4 series · 4 of 4 positions shown · non-contrast
Comparison: 12/26/2015 CT.

CLINICAL DATA: 41-year-old male post fall 10/28/2017. Persistent
right shoulder pain. Initial encounter.

EXAM:
RIGHT SHOULDER - 2+ VIEW

[shoulder ap (1 of 2)]
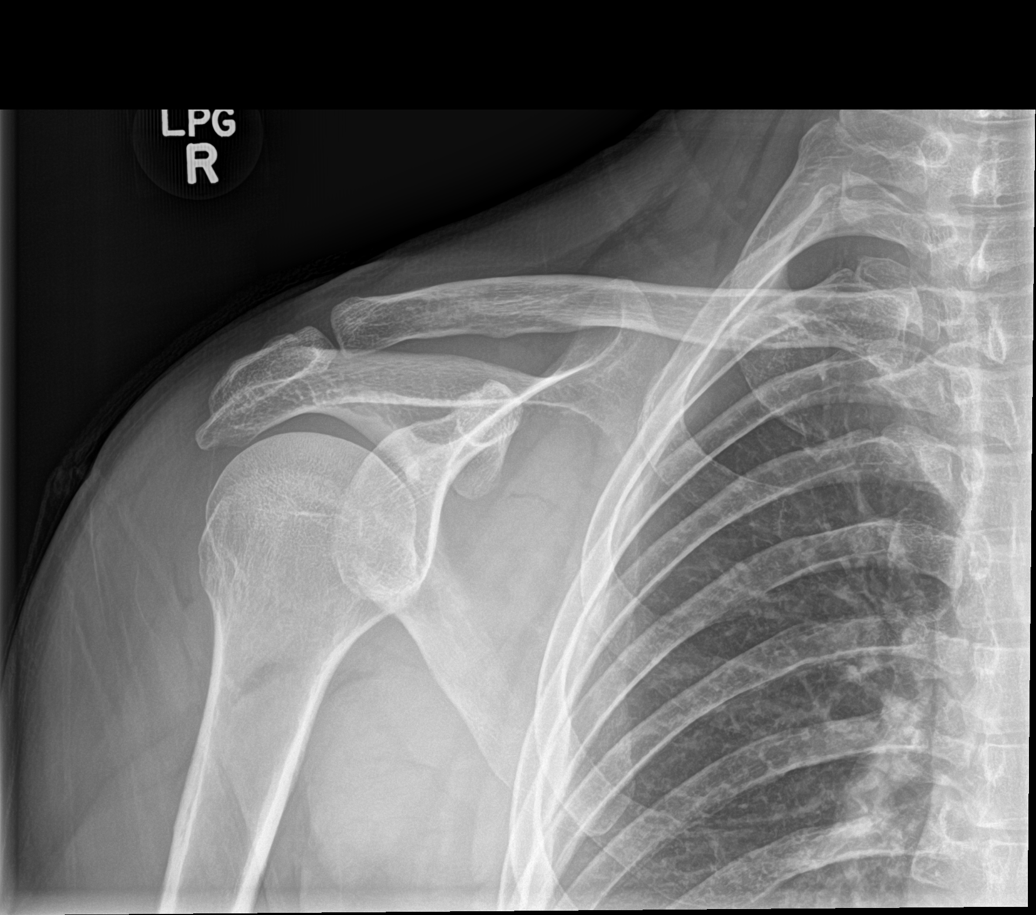

[shoulder ap (2 of 2)]
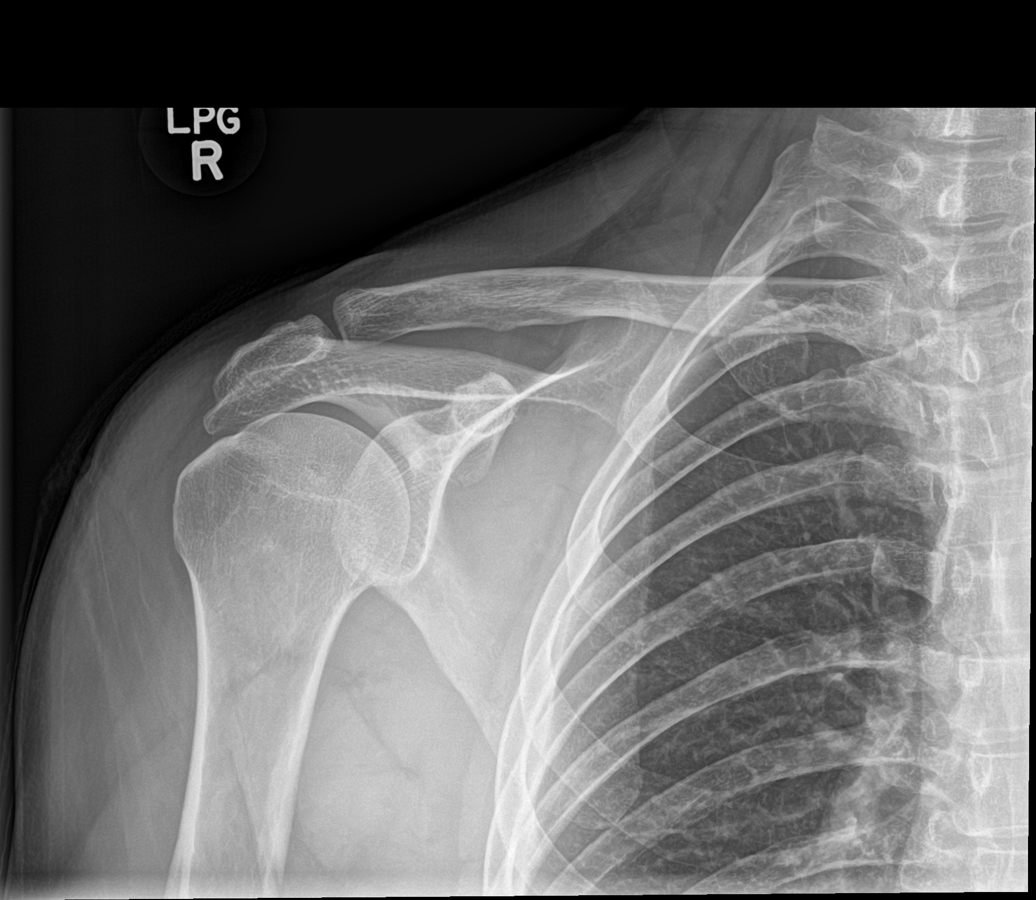

[shoulder y-view]
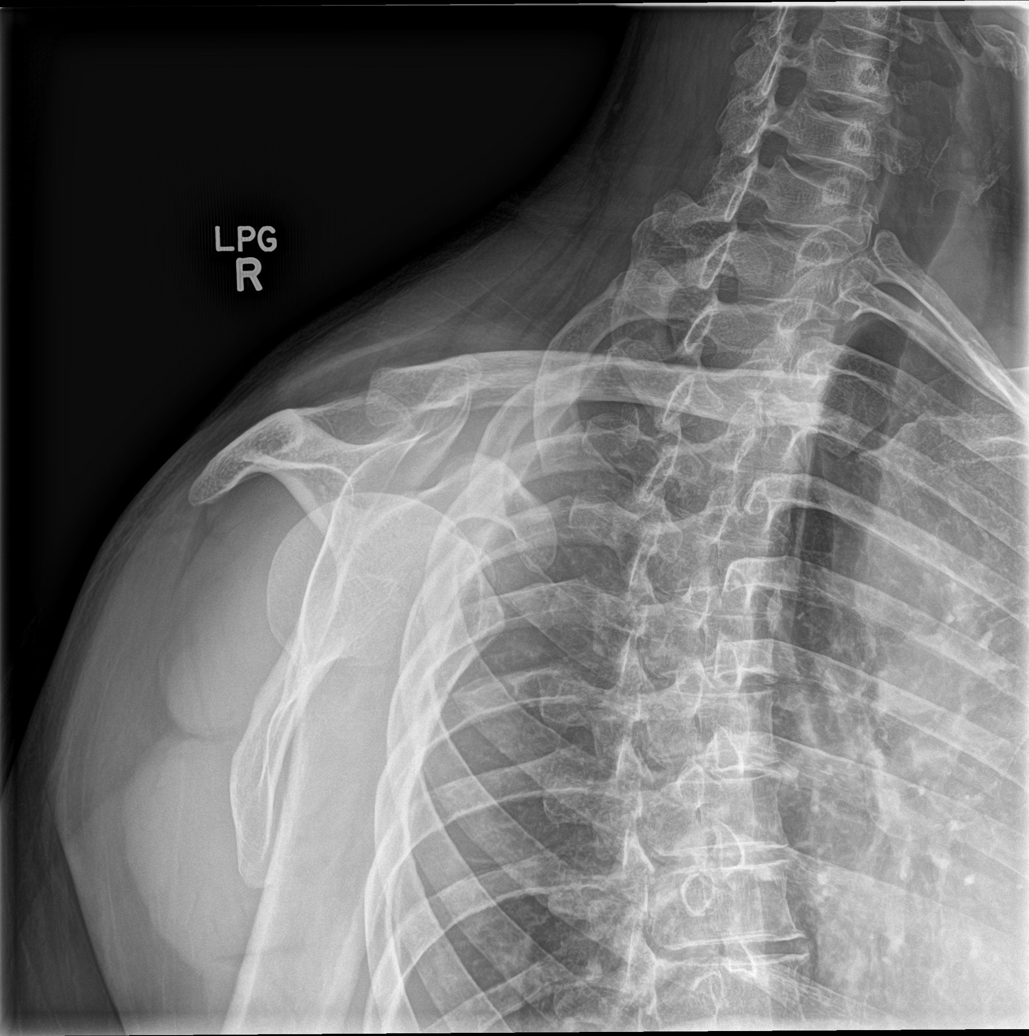

[shoulder axial]
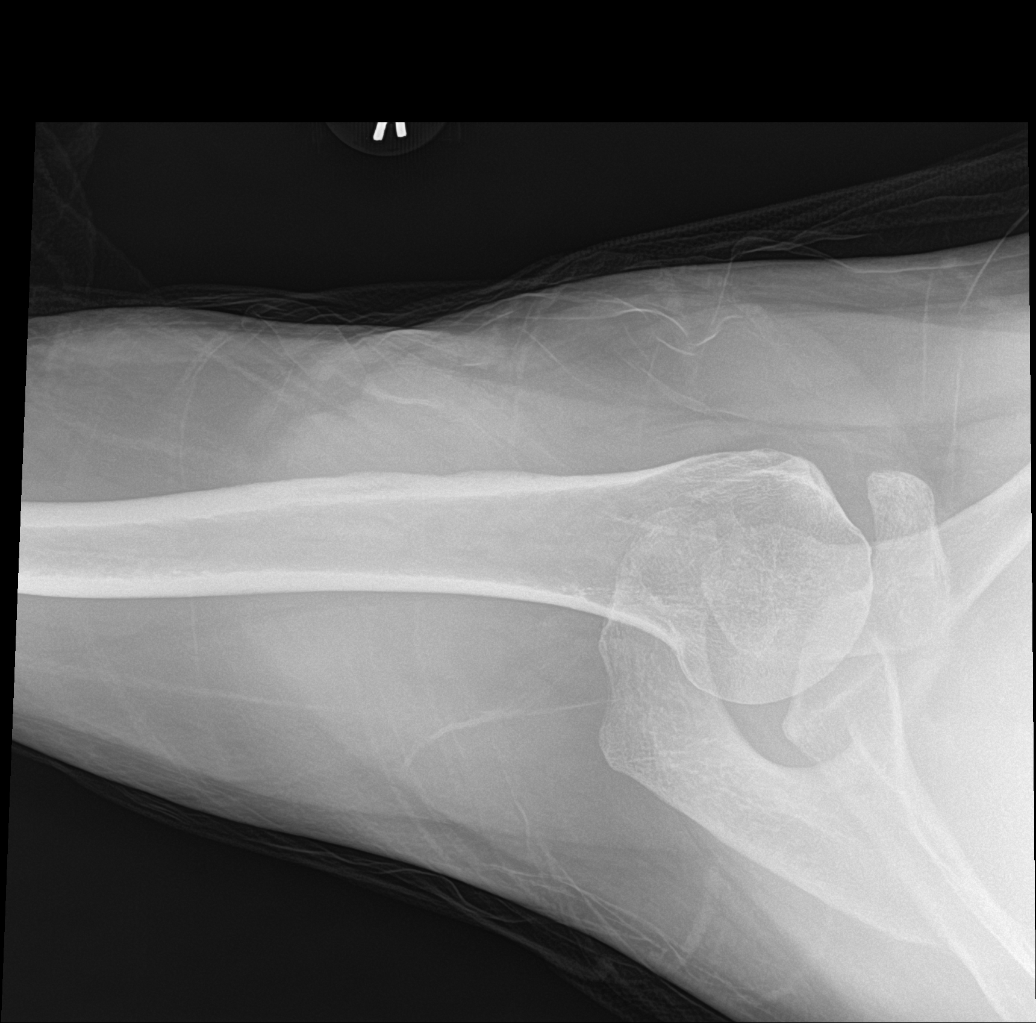

[4 of 4 positions shown; findings below may reference images not displayed]

FINDINGS: No fracture or dislocation.

Slightly downsloping acromion.

No abnormal soft tissue calcifications.

Visualized lungs clear.
IMPRESSION: No fracture or dislocation.
